# Patient Record
Sex: Male | Born: 1960 | Race: White | Hispanic: No | Marital: Single | Smoking: Never smoker
Health system: Southern US, Community
[De-identification: ages and names within clinical notes are randomized; demographics above are authoritative.]

## PROBLEM LIST (undated history)

## (undated) DIAGNOSIS — C801 Malignant (primary) neoplasm, unspecified: Secondary | ICD-10-CM

## (undated) DIAGNOSIS — M109 Gout, unspecified: Secondary | ICD-10-CM

## (undated) HISTORY — PX: HAND SURGERY: SHX662

## (undated) HISTORY — PX: ROTATOR CUFF REPAIR: SHX139

---

## 2000-02-21 ENCOUNTER — Encounter: Payer: Self-pay | Admitting: *Deleted

## 2000-02-21 ENCOUNTER — Ambulatory Visit (HOSPITAL_COMMUNITY): Admission: RE | Admit: 2000-02-21 | Discharge: 2000-02-21 | Payer: Self-pay | Admitting: *Deleted

## 2001-07-05 ENCOUNTER — Ambulatory Visit (HOSPITAL_COMMUNITY): Admission: RE | Admit: 2001-07-05 | Discharge: 2001-07-05 | Payer: Self-pay | Admitting: Family Medicine

## 2001-07-05 ENCOUNTER — Encounter: Payer: Self-pay | Admitting: Family Medicine

## 2001-07-09 ENCOUNTER — Encounter: Payer: Self-pay | Admitting: Family Medicine

## 2001-07-09 ENCOUNTER — Ambulatory Visit (HOSPITAL_COMMUNITY): Admission: RE | Admit: 2001-07-09 | Discharge: 2001-07-09 | Payer: Self-pay | Admitting: Family Medicine

## 2002-02-11 ENCOUNTER — Encounter: Payer: Self-pay | Admitting: Family Medicine

## 2002-02-11 ENCOUNTER — Ambulatory Visit (HOSPITAL_COMMUNITY): Admission: RE | Admit: 2002-02-11 | Discharge: 2002-02-11 | Payer: Self-pay | Admitting: Family Medicine

## 2018-05-28 MED FILL — FEBUXOSTAT 40 MG TABS: 40 | 30 days supply | Qty: 30 | Fill #0

## 2018-07-01 MED FILL — FEBUXOSTAT 40 MG TABS: 40 | 30 days supply | Qty: 30 | Fill #1

## 2018-07-10 DIAGNOSIS — H5203 Hypermetropia, bilateral: Secondary | ICD-10-CM | POA: Diagnosis not present

## 2018-07-10 DIAGNOSIS — H52223 Regular astigmatism, bilateral: Secondary | ICD-10-CM | POA: Diagnosis not present

## 2018-08-05 MED FILL — FEBUXOSTAT 40 MG TABS: 40 | 30 days supply | Qty: 30 | Fill #0

## 2018-08-20 DIAGNOSIS — Z125 Encounter for screening for malignant neoplasm of prostate: Secondary | ICD-10-CM | POA: Diagnosis not present

## 2018-08-20 DIAGNOSIS — M109 Gout, unspecified: Secondary | ICD-10-CM | POA: Diagnosis not present

## 2018-08-20 DIAGNOSIS — E781 Pure hyperglyceridemia: Secondary | ICD-10-CM | POA: Diagnosis not present

## 2018-08-20 DIAGNOSIS — R82998 Other abnormal findings in urine: Secondary | ICD-10-CM | POA: Diagnosis not present

## 2018-08-20 DIAGNOSIS — Z0000 Encounter for general adult medical examination without abnormal findings: Secondary | ICD-10-CM | POA: Diagnosis not present

## 2018-08-27 DIAGNOSIS — Z0000 Encounter for general adult medical examination without abnormal findings: Secondary | ICD-10-CM | POA: Diagnosis not present

## 2018-08-27 DIAGNOSIS — Z681 Body mass index (BMI) 19 or less, adult: Secondary | ICD-10-CM | POA: Diagnosis not present

## 2018-08-27 DIAGNOSIS — E781 Pure hyperglyceridemia: Secondary | ICD-10-CM | POA: Diagnosis not present

## 2018-08-27 DIAGNOSIS — M109 Gout, unspecified: Secondary | ICD-10-CM | POA: Diagnosis not present

## 2018-08-27 DIAGNOSIS — J328 Other chronic sinusitis: Secondary | ICD-10-CM | POA: Diagnosis not present

## 2018-08-27 DIAGNOSIS — Z1331 Encounter for screening for depression: Secondary | ICD-10-CM | POA: Diagnosis not present

## 2018-08-27 DIAGNOSIS — M720 Palmar fascial fibromatosis [Dupuytren]: Secondary | ICD-10-CM | POA: Diagnosis not present

## 2018-08-27 DIAGNOSIS — Z1212 Encounter for screening for malignant neoplasm of rectum: Secondary | ICD-10-CM | POA: Diagnosis not present

## 2018-08-27 MED FILL — AZELASTINE HCL 137 MCG SPRY: 0.1 | 90 days supply | Qty: 30 | Fill #0

## 2018-09-04 DIAGNOSIS — M720 Palmar fascial fibromatosis [Dupuytren]: Secondary | ICD-10-CM | POA: Diagnosis not present

## 2018-09-04 DIAGNOSIS — M79641 Pain in right hand: Secondary | ICD-10-CM | POA: Diagnosis not present

## 2018-09-12 MED FILL — FEBUXOSTAT 40 MG TABS: 40 | 30 days supply | Qty: 30 | Fill #1

## 2018-09-17 DIAGNOSIS — R195 Other fecal abnormalities: Secondary | ICD-10-CM | POA: Diagnosis not present

## 2018-09-17 DIAGNOSIS — R740 Nonspecific elevation of levels of transaminase and lactic acid dehydrogenase [LDH]: Secondary | ICD-10-CM | POA: Diagnosis not present

## 2018-09-17 DIAGNOSIS — K5730 Diverticulosis of large intestine without perforation or abscess without bleeding: Secondary | ICD-10-CM | POA: Diagnosis not present

## 2018-09-28 MED FILL — FEBUXOSTAT 40 MG TABS: 40 | 90 days supply | Qty: 90 | Fill #0

## 2018-11-26 DIAGNOSIS — M720 Palmar fascial fibromatosis [Dupuytren]: Secondary | ICD-10-CM | POA: Diagnosis not present

## 2018-11-26 DIAGNOSIS — S4991XA Unspecified injury of right shoulder and upper arm, initial encounter: Secondary | ICD-10-CM | POA: Diagnosis not present

## 2018-11-26 MED FILL — CEPHALEXIN 500 MG CAPSULE: 500 | 9 days supply | Qty: 36 | Fill #0

## 2018-11-26 MED FILL — traMADol HCL 50 MG TABS: 50 | 8 days supply | Qty: 50 | Fill #0

## 2018-11-26 MED FILL — ONDANSETRON HCL 8 MG TABLET: 8 | 5 days supply | Qty: 15 | Fill #0

## 2018-12-03 DIAGNOSIS — M25641 Stiffness of right hand, not elsewhere classified: Secondary | ICD-10-CM | POA: Diagnosis not present

## 2018-12-18 DIAGNOSIS — M79641 Pain in right hand: Secondary | ICD-10-CM | POA: Diagnosis not present

## 2019-01-23 MED FILL — FEBUXOSTAT 40 MG TABS: 40 | 90 days supply | Qty: 90 | Fill #1

## 2019-05-19 MED FILL — FEBUXOSTAT 40 MG TABS: 40 | 90 days supply | Qty: 90 | Fill #2

## 2019-06-17 DIAGNOSIS — Z23 Encounter for immunization: Secondary | ICD-10-CM | POA: Diagnosis not present

## 2019-06-17 DIAGNOSIS — L820 Inflamed seborrheic keratosis: Secondary | ICD-10-CM | POA: Diagnosis not present

## 2019-06-17 DIAGNOSIS — D0439 Carcinoma in situ of skin of other parts of face: Secondary | ICD-10-CM | POA: Diagnosis not present

## 2019-06-17 DIAGNOSIS — D485 Neoplasm of uncertain behavior of skin: Secondary | ICD-10-CM | POA: Diagnosis not present

## 2019-06-23 MED FILL — FLUOROURACIL 5 % CREA: 5 | 25 days supply | Qty: 40 | Fill #0

## 2019-08-29 DIAGNOSIS — R82998 Other abnormal findings in urine: Secondary | ICD-10-CM | POA: Diagnosis not present

## 2019-08-29 DIAGNOSIS — Z125 Encounter for screening for malignant neoplasm of prostate: Secondary | ICD-10-CM | POA: Diagnosis not present

## 2019-08-29 DIAGNOSIS — Z0000 Encounter for general adult medical examination without abnormal findings: Secondary | ICD-10-CM | POA: Diagnosis not present

## 2019-08-29 DIAGNOSIS — M109 Gout, unspecified: Secondary | ICD-10-CM | POA: Diagnosis not present

## 2019-08-29 DIAGNOSIS — E781 Pure hyperglyceridemia: Secondary | ICD-10-CM | POA: Diagnosis not present

## 2019-09-05 ENCOUNTER — Other Ambulatory Visit (HOSPITAL_COMMUNITY): Payer: Self-pay | Admitting: Internal Medicine

## 2019-09-05 DIAGNOSIS — M109 Gout, unspecified: Secondary | ICD-10-CM | POA: Diagnosis not present

## 2019-09-05 DIAGNOSIS — Z1331 Encounter for screening for depression: Secondary | ICD-10-CM | POA: Diagnosis not present

## 2019-09-05 DIAGNOSIS — Z0000 Encounter for general adult medical examination without abnormal findings: Secondary | ICD-10-CM | POA: Diagnosis not present

## 2019-09-05 MED FILL — AZELASTINE HCL 137 MCG/SPRA: 137 | 90 days supply | Qty: 30 | Fill #0

## 2019-09-05 MED FILL — FEBUXOSTAT 40 MG TABS: 40 | 90 days supply | Qty: 90 | Fill #0

## 2019-09-10 DIAGNOSIS — L570 Actinic keratosis: Secondary | ICD-10-CM | POA: Diagnosis not present

## 2019-09-10 DIAGNOSIS — Z85828 Personal history of other malignant neoplasm of skin: Secondary | ICD-10-CM | POA: Diagnosis not present

## 2019-09-10 DIAGNOSIS — L578 Other skin changes due to chronic exposure to nonionizing radiation: Secondary | ICD-10-CM | POA: Diagnosis not present

## 2019-09-10 DIAGNOSIS — D225 Melanocytic nevi of trunk: Secondary | ICD-10-CM | POA: Diagnosis not present

## 2020-01-15 MED FILL — FEBUXOSTAT 40 MG TABS: 40 | 90 days supply | Qty: 90 | Fill #0

## 2020-02-09 ENCOUNTER — Other Ambulatory Visit (HOSPITAL_COMMUNITY): Payer: Self-pay | Admitting: Internal Medicine

## 2020-02-09 MED FILL — SHINGRIX 50 MCG SUS: 50 | 1 days supply | Qty: 1 | Fill #0

## 2020-03-18 MED FILL — COLCHICINE 0.6 MG TABS: 0.6 | 7 days supply | Qty: 15 | Fill #0

## 2020-04-10 MED FILL — SHINGRIX 50 MCG SUS: 50 | 1 days supply | Qty: 1 | Fill #1

## 2020-04-22 MED FILL — FEBUXOSTAT 40 MG TABS: 40 | 90 days supply | Qty: 90 | Fill #1

## 2020-06-07 ENCOUNTER — Other Ambulatory Visit (HOSPITAL_COMMUNITY): Payer: Self-pay | Admitting: Registered Nurse

## 2020-06-07 ENCOUNTER — Other Ambulatory Visit (HOSPITAL_COMMUNITY): Payer: Self-pay | Admitting: Internal Medicine

## 2020-06-07 DIAGNOSIS — R1011 Right upper quadrant pain: Secondary | ICD-10-CM

## 2020-06-07 DIAGNOSIS — R110 Nausea: Secondary | ICD-10-CM

## 2020-06-07 MED FILL — ONDANSETRON ODT 4 MG TABLET: 4 | 10 days supply | Qty: 42 | Fill #0

## 2020-06-07 MED FILL — traMADol HCL 50 MG TABS: 50 | 5 days supply | Qty: 15 | Fill #0

## 2020-06-08 ENCOUNTER — Other Ambulatory Visit: Payer: Self-pay

## 2020-06-08 ENCOUNTER — Ambulatory Visit (HOSPITAL_COMMUNITY)
Admission: RE | Admit: 2020-06-08 | Discharge: 2020-06-08 | Disposition: A | Discharge status: Home / Self Care | Payer: 59 | Source: Ambulatory Visit | Attending: Internal Medicine | Admitting: Internal Medicine

## 2020-06-08 DIAGNOSIS — R1011 Right upper quadrant pain: Secondary | ICD-10-CM | POA: Diagnosis not present

## 2020-06-08 DIAGNOSIS — R110 Nausea: Secondary | ICD-10-CM | POA: Insufficient documentation

## 2020-06-08 DIAGNOSIS — K8020 Calculus of gallbladder without cholecystitis without obstruction: Secondary | ICD-10-CM | POA: Diagnosis not present

## 2020-07-28 MED FILL — FEBUXOSTAT 40 MG TABS: 40 | 90 days supply | Qty: 90 | Fill #2

## 2020-09-03 DIAGNOSIS — Z125 Encounter for screening for malignant neoplasm of prostate: Secondary | ICD-10-CM | POA: Diagnosis not present

## 2020-09-03 DIAGNOSIS — Z0000 Encounter for general adult medical examination without abnormal findings: Secondary | ICD-10-CM | POA: Diagnosis not present

## 2020-09-03 DIAGNOSIS — M109 Gout, unspecified: Secondary | ICD-10-CM | POA: Diagnosis not present

## 2020-09-03 DIAGNOSIS — E781 Pure hyperglyceridemia: Secondary | ICD-10-CM | POA: Diagnosis not present

## 2020-09-09 DIAGNOSIS — L570 Actinic keratosis: Secondary | ICD-10-CM | POA: Diagnosis not present

## 2020-09-09 DIAGNOSIS — L821 Other seborrheic keratosis: Secondary | ICD-10-CM | POA: Diagnosis not present

## 2020-09-09 DIAGNOSIS — Z85828 Personal history of other malignant neoplasm of skin: Secondary | ICD-10-CM | POA: Diagnosis not present

## 2020-09-09 DIAGNOSIS — D485 Neoplasm of uncertain behavior of skin: Secondary | ICD-10-CM | POA: Diagnosis not present

## 2020-09-09 DIAGNOSIS — L851 Acquired keratosis [keratoderma] palmaris et plantaris: Secondary | ICD-10-CM | POA: Diagnosis not present

## 2020-09-09 DIAGNOSIS — L578 Other skin changes due to chronic exposure to nonionizing radiation: Secondary | ICD-10-CM | POA: Diagnosis not present

## 2020-09-09 DIAGNOSIS — D225 Melanocytic nevi of trunk: Secondary | ICD-10-CM | POA: Diagnosis not present

## 2020-09-09 DIAGNOSIS — L814 Other melanin hyperpigmentation: Secondary | ICD-10-CM | POA: Diagnosis not present

## 2020-09-10 DIAGNOSIS — K8020 Calculus of gallbladder without cholecystitis without obstruction: Secondary | ICD-10-CM | POA: Diagnosis not present

## 2020-09-10 DIAGNOSIS — M109 Gout, unspecified: Secondary | ICD-10-CM | POA: Diagnosis not present

## 2020-09-10 DIAGNOSIS — R82998 Other abnormal findings in urine: Secondary | ICD-10-CM | POA: Diagnosis not present

## 2020-09-10 DIAGNOSIS — Z0000 Encounter for general adult medical examination without abnormal findings: Secondary | ICD-10-CM | POA: Diagnosis not present

## 2020-09-10 DIAGNOSIS — Z1212 Encounter for screening for malignant neoplasm of rectum: Secondary | ICD-10-CM | POA: Diagnosis not present

## 2020-09-10 DIAGNOSIS — Z1331 Encounter for screening for depression: Secondary | ICD-10-CM | POA: Diagnosis not present

## 2020-11-08 DIAGNOSIS — H52223 Regular astigmatism, bilateral: Secondary | ICD-10-CM | POA: Diagnosis not present

## 2020-11-08 DIAGNOSIS — H5203 Hypermetropia, bilateral: Secondary | ICD-10-CM | POA: Diagnosis not present

## 2020-11-08 DIAGNOSIS — H2513 Age-related nuclear cataract, bilateral: Secondary | ICD-10-CM | POA: Diagnosis not present

## 2020-11-08 DIAGNOSIS — H524 Presbyopia: Secondary | ICD-10-CM | POA: Diagnosis not present

## 2020-11-30 ENCOUNTER — Other Ambulatory Visit (HOSPITAL_COMMUNITY): Payer: Self-pay

## 2020-12-01 ENCOUNTER — Other Ambulatory Visit (HOSPITAL_COMMUNITY): Payer: Self-pay

## 2020-12-02 ENCOUNTER — Other Ambulatory Visit (HOSPITAL_COMMUNITY): Payer: Self-pay

## 2020-12-02 MED ORDER — FEBUXOSTAT 40 MG PO TABS
40.0000 mg | ORAL_TABLET | Freq: Every day | ORAL | 4 refills | Status: DC
  Filled 2020-12-02: qty 90, 90d supply, fill #0
  Filled 2021-03-02: qty 90, 90d supply, fill #1
  Filled 2021-09-05: qty 90, 90d supply, fill #0

## 2020-12-03 ENCOUNTER — Other Ambulatory Visit (HOSPITAL_COMMUNITY): Payer: Self-pay

## 2020-12-13 DIAGNOSIS — D485 Neoplasm of uncertain behavior of skin: Secondary | ICD-10-CM | POA: Diagnosis not present

## 2020-12-13 DIAGNOSIS — C44519 Basal cell carcinoma of skin of other part of trunk: Secondary | ICD-10-CM | POA: Diagnosis not present

## 2020-12-20 DIAGNOSIS — H524 Presbyopia: Secondary | ICD-10-CM | POA: Diagnosis not present

## 2020-12-20 DIAGNOSIS — H52223 Regular astigmatism, bilateral: Secondary | ICD-10-CM | POA: Diagnosis not present

## 2020-12-20 DIAGNOSIS — H5203 Hypermetropia, bilateral: Secondary | ICD-10-CM | POA: Diagnosis not present

## 2020-12-20 DIAGNOSIS — H2513 Age-related nuclear cataract, bilateral: Secondary | ICD-10-CM | POA: Diagnosis not present

## 2020-12-30 DIAGNOSIS — K8010 Calculus of gallbladder with chronic cholecystitis without obstruction: Secondary | ICD-10-CM | POA: Diagnosis not present

## 2021-01-07 DIAGNOSIS — C44519 Basal cell carcinoma of skin of other part of trunk: Secondary | ICD-10-CM | POA: Diagnosis not present

## 2021-01-31 ENCOUNTER — Ambulatory Visit: Payer: Self-pay | Admitting: General Surgery

## 2021-02-07 NOTE — Progress Notes (Addendum)
COVID Vaccine Completed: x3 Date COVID Vaccine completed: Has received booster: COVID vaccine manufacturer: [employer]   Date of COVID positive in last 90 days: No  PCP - Velna Hatchet, MD Cardiologist - N/a  Chest x-ray - N/a EKG - N/a Stress Test - N/a ECHO - N/a Cardiac Cath - N/a Pacemaker/ICD device last checked: N/a Spinal Cord Stimulator: N/a  Sleep Study - N/a CPAP -   Fasting Blood Sugar - N/a Checks Blood Sugar _____ times a day  Blood Thinner Instructions: N/a Aspirin Instructions: Last Dose:  Activity level: Can go up a flight of stairs and perform activities of daily living without stopping and without symptoms of chest pain or shortness of breath.     Anesthesia review:   Patient denies shortness of breath, fever, cough and chest pain at PAT appointment   Patient verbalized understanding of instructions that were given to them at the PAT appointment. Patient was also instructed that they will need to review over the PAT instructions again at home before surgery.

## 2021-02-07 NOTE — Patient Instructions (Addendum)
DUE TO COVID-19 ONLY ONE VISITOR IS ALLOWED TO COME WITH YOU AND STAY IN THE WAITING ROOM ONLY DURING PRE OP AND PROCEDURE.   **NO VISITORS ARE ALLOWED IN THE SHORT STAY AREA OR RECOVERY ROOM!!**       Your procedure is scheduled on: 02/18/21   Report to [facility] Main  Entrance    Report to admitting at 12:30 PM   Call this number if you have problems the morning of surgery [phone]   Do not eat food :After Midnight.   May have liquids until  11:30 AM day of surgery  CLEAR LIQUID DIET  Foods Allowed                                                                     Foods Excluded  Water, Black Coffee and tea, regular and decaf               liquids that you cannot  Plain Jell-O in any flavor  (No red)                                     see through such as: Fruit ices (not with fruit pulp)                                             milk, soups, orange juice              Iced Popsicles (No red)                                                 All solid food                                   Apple juices Sports drinks like Gatorade (No red) Lightly seasoned clear broth or consume(fat free) Sugar, honey syrup    Oral Hygiene is also important to reduce your risk of infection.                                    Remember - BRUSH YOUR TEETH THE MORNING OF SURGERY WITH YOUR REGULAR TOOTHPASTE    Take these medicines the morning of surgery with A SIP OF WATER: Uloric                              You may not have any metal on your body including jewelry, and body piercing             Do not wear lotions, powders, perfumes/cologne, or deodorant              Men may shave face and neck.   Do not bring valuables to the hospital. [facility] IS NOT             RESPONSIBLE   FOR VALUABLES.    Patients discharged the day of surgery will not be allowed to drive home.  Special Instructions: Bring a copy of your healthcare power of attorney and living will documents          the day of surgery if you haven't scanned them in before.   Please read over the following fact sheets you were given: IF YOU HAVE QUESTIONS ABOUT YOUR PRE OP INSTRUCTIONS PLEASE CALL [phone]- [facility] - Preparing for Surgery Before surgery, you can play an important role.  Because skin is not sterile, your skin needs to be as free of germs as possible.  You can reduce the number of germs on your skin by washing with CHG (chlorahexidine gluconate) soap before surgery.  CHG is an antiseptic cleaner which kills germs and bonds with the skin to continue killing germs even after washing. Please DO NOT use if you have an allergy to CHG or antibacterial soaps.  If your skin becomes reddened/irritated stop using the CHG and inform your nurse when you arrive at Short Stay. Do not shave (including legs and underarms) for at least 48 hours prior to the first CHG shower.  You may shave your face/neck.  Please follow these instructions carefully:  1.  Shower with CHG Soap the night before surgery and the  morning of surgery.  2.  If you choose to wash your hair, wash your hair first as usual with your normal  shampoo.  3.  After you shampoo, rinse your hair and body thoroughly to remove the shampoo.                             4.  Use CHG as you would any other liquid soap.  You can apply chg directly to the skin and wash.  Gently with a scrungie or clean washcloth.  5.  Apply the CHG Soap to your body ONLY FROM THE NECK DOWN.   Do   not use on face/ open                           Wound or open sores. Avoid contact with eyes, ears mouth and   genitals (private parts).                       Wash face,  Genitals (private parts) with your normal soap.             6.  Wash thoroughly, paying special attention to the area where your    surgery  will be performed.  7.  Thoroughly rinse your body with warm water from the neck down.  8.  DO NOT shower/wash with your normal soap after using and  rinsing off the CHG Soap.                9.  Pat yourself dry with a clean towel.            10.  Wear clean pajamas.            11.  Place clean sheets on your bed the night of your first shower and do not  sleep with pets. Day of Surgery : Do not apply any lotions/deodorants the morning of surgery.  Please wear clean  clothes to the hospital/surgery center.  FAILURE TO FOLLOW THESE INSTRUCTIONS MAY RESULT IN THE CANCELLATION OF YOUR SURGERY  PATIENT SIGNATURE_________________________________  NURSE SIGNATURE__________________________________  ________________________________________________________________________

## 2021-02-08 ENCOUNTER — Encounter (HOSPITAL_COMMUNITY)
Admission: RE | Admit: 2021-02-08 | Discharge: 2021-02-08 | Disposition: A | Discharge status: Home / Self Care | Payer: 59 | Source: Ambulatory Visit | Attending: General Surgery | Admitting: General Surgery

## 2021-02-08 ENCOUNTER — Encounter (HOSPITAL_COMMUNITY): Payer: Self-pay

## 2021-02-08 ENCOUNTER — Other Ambulatory Visit: Payer: Self-pay

## 2021-02-08 DIAGNOSIS — Z01812 Encounter for preprocedural laboratory examination: Secondary | ICD-10-CM | POA: Diagnosis not present

## 2021-02-08 HISTORY — DX: Gout, unspecified: M10.9

## 2021-02-08 HISTORY — DX: Malignant (primary) neoplasm, unspecified: C80.1

## 2021-02-08 LAB — CBC
HCT: 40.2 % (ref 39.0–52.0)
Hemoglobin: 13.7 g/dL (ref 13.0–17.0)
MCH: 33.3 pg (ref 26.0–34.0)
MCHC: 34.1 g/dL (ref 30.0–36.0)
MCV: 97.6 fL (ref 80.0–100.0)
Platelets: 239 10*3/uL (ref 150–400)
RBC: 4.12 MIL/uL — ABNORMAL LOW (ref 4.22–5.81)
RDW: 11.9 % (ref 11.5–15.5)
WBC: 6.4 10*3/uL (ref 4.0–10.5)
nRBC: 0 % (ref 0.0–0.2)

## 2021-02-18 ENCOUNTER — Ambulatory Visit (HOSPITAL_COMMUNITY): Payer: 59 | Admitting: Certified Registered Nurse Anesthetist

## 2021-02-18 ENCOUNTER — Encounter (HOSPITAL_COMMUNITY): Payer: Self-pay | Admitting: General Surgery

## 2021-02-18 ENCOUNTER — Ambulatory Visit (HOSPITAL_COMMUNITY)
Admission: RE | Admit: 2021-02-18 | Discharge: 2021-02-18 | Disposition: A | Discharge status: Home / Self Care | Payer: 59 | Attending: General Surgery | Admitting: General Surgery

## 2021-02-18 ENCOUNTER — Encounter (HOSPITAL_COMMUNITY)
Admission: RE | Disposition: A | Discharge status: Home / Self Care | Payer: Self-pay | Source: Home / Self Care | Attending: General Surgery

## 2021-02-18 ENCOUNTER — Other Ambulatory Visit (HOSPITAL_COMMUNITY): Payer: Self-pay

## 2021-02-18 DIAGNOSIS — Z880 Allergy status to penicillin: Secondary | ICD-10-CM | POA: Insufficient documentation

## 2021-02-18 DIAGNOSIS — Z885 Allergy status to narcotic agent status: Secondary | ICD-10-CM | POA: Insufficient documentation

## 2021-02-18 DIAGNOSIS — K8080 Other cholelithiasis without obstruction: Secondary | ICD-10-CM | POA: Diagnosis present

## 2021-02-18 DIAGNOSIS — Z888 Allergy status to other drugs, medicaments and biological substances status: Secondary | ICD-10-CM | POA: Diagnosis not present

## 2021-02-18 DIAGNOSIS — Z79899 Other long term (current) drug therapy: Secondary | ICD-10-CM | POA: Insufficient documentation

## 2021-02-18 DIAGNOSIS — K8010 Calculus of gallbladder with chronic cholecystitis without obstruction: Secondary | ICD-10-CM | POA: Insufficient documentation

## 2021-02-18 DIAGNOSIS — K811 Chronic cholecystitis: Secondary | ICD-10-CM | POA: Diagnosis not present

## 2021-02-18 DIAGNOSIS — Z87891 Personal history of nicotine dependence: Secondary | ICD-10-CM | POA: Diagnosis not present

## 2021-02-18 HISTORY — PX: CHOLECYSTECTOMY: SHX55

## 2021-02-18 SURGERY — LAPAROSCOPIC CHOLECYSTECTOMY
Anesthesia: General | Site: Abdomen

## 2021-02-18 MED ORDER — ROCURONIUM BROMIDE 10 MG/ML (PF) SYRINGE
PREFILLED_SYRINGE | INTRAVENOUS | Status: DC | PRN
  Administered 2021-02-18: 40 mg via INTRAVENOUS
  Administered 2021-02-18: 20 mg via INTRAVENOUS

## 2021-02-18 MED ORDER — EPHEDRINE 5 MG/ML INJ
INTRAVENOUS | Status: AC
  Filled 2021-02-18: qty 5

## 2021-02-18 MED ORDER — MIDAZOLAM HCL 2 MG/2ML IJ SOLN
INTRAMUSCULAR | Status: AC
  Filled 2021-02-18: qty 2

## 2021-02-18 MED ORDER — 0.9 % SODIUM CHLORIDE (POUR BTL) OPTIME
TOPICAL | Status: DC | PRN
  Administered 2021-02-18: 1000 mL

## 2021-02-18 MED ORDER — KETOROLAC TROMETHAMINE 30 MG/ML IJ SOLN
30.0000 mg | Freq: Once | INTRAMUSCULAR | Status: AC | PRN
  Administered 2021-02-18: 30 mg via INTRAVENOUS

## 2021-02-18 MED ORDER — CHLORHEXIDINE GLUCONATE CLOTH 2 % EX PADS: 6.0000 | MEDICATED_PAD | Freq: Once | CUTANEOUS | Status: DC

## 2021-02-18 MED ORDER — ONDANSETRON HCL 4 MG/2ML IJ SOLN
INTRAMUSCULAR | Status: DC | PRN
  Administered 2021-02-18: 4 mg via INTRAVENOUS

## 2021-02-18 MED ORDER — ORAL CARE MOUTH RINSE: 15.0000 mL | Freq: Once | OROMUCOSAL | Status: AC

## 2021-02-18 MED ORDER — FENTANYL CITRATE (PF) 100 MCG/2ML IJ SOLN
25.0000 ug | INTRAMUSCULAR | Status: DC | PRN
  Administered 2021-02-18: 50 ug via INTRAVENOUS
  Administered 2021-02-18: 25 ug via INTRAVENOUS

## 2021-02-18 MED ORDER — PROPOFOL 10 MG/ML IV BOLUS
INTRAVENOUS | Status: AC
  Filled 2021-02-18: qty 20

## 2021-02-18 MED ORDER — PHENYLEPHRINE 40 MCG/ML (10ML) SYRINGE FOR IV PUSH (FOR BLOOD PRESSURE SUPPORT)
PREFILLED_SYRINGE | INTRAVENOUS | Status: AC
  Filled 2021-02-18: qty 10

## 2021-02-18 MED ORDER — OXYCODONE HCL 5 MG PO TABS
ORAL_TABLET | ORAL | Status: AC
  Filled 2021-02-18: qty 1

## 2021-02-18 MED ORDER — FENTANYL CITRATE (PF) 250 MCG/5ML IJ SOLN
INTRAMUSCULAR | Status: AC
  Filled 2021-02-18: qty 5

## 2021-02-18 MED ORDER — DEXAMETHASONE SODIUM PHOSPHATE 4 MG/ML IJ SOLN
INTRAMUSCULAR | Status: DC | PRN
  Administered 2021-02-18: 10 mg via INTRAVENOUS

## 2021-02-18 MED ORDER — KETOROLAC TROMETHAMINE 30 MG/ML IJ SOLN
INTRAMUSCULAR | Status: AC
  Filled 2021-02-18: qty 1

## 2021-02-18 MED ORDER — IBUPROFEN 800 MG PO TABS
800.0000 mg | ORAL_TABLET | Freq: Three times a day (TID) | ORAL | 0 refills | Status: AC | PRN
Start: 2021-02-18 — End: ?
  Filled 2021-02-18: qty 30, 10d supply, fill #0

## 2021-02-18 MED ORDER — AMISULPRIDE (ANTIEMETIC) 5 MG/2ML IV SOLN: 10.0000 mg | Freq: Once | INTRAVENOUS | Status: DC | PRN

## 2021-02-18 MED ORDER — PROMETHAZINE HCL 25 MG/ML IJ SOLN: 6.2500 mg | INTRAMUSCULAR | Status: DC | PRN

## 2021-02-18 MED ORDER — OXYCODONE HCL 5 MG PO TABS
5.0000 mg | ORAL_TABLET | Freq: Once | ORAL | Status: AC | PRN
  Administered 2021-02-18: 5 mg via ORAL

## 2021-02-18 MED ORDER — ACETAMINOPHEN 500 MG PO TABS
1000.0000 mg | ORAL_TABLET | ORAL | Status: AC
  Administered 2021-02-18: 1000 mg via ORAL
  Filled 2021-02-18: qty 2

## 2021-02-18 MED ORDER — SUGAMMADEX SODIUM 200 MG/2ML IV SOLN
INTRAVENOUS | Status: DC | PRN
  Administered 2021-02-18: 200 mg via INTRAVENOUS

## 2021-02-18 MED ORDER — DEXAMETHASONE SODIUM PHOSPHATE 10 MG/ML IJ SOLN
INTRAMUSCULAR | Status: AC
  Filled 2021-02-18: qty 1

## 2021-02-18 MED ORDER — CHLORHEXIDINE GLUCONATE 0.12 % MT SOLN
15.0000 mL | Freq: Once | OROMUCOSAL | Status: AC
  Administered 2021-02-18: 15 mL via OROMUCOSAL

## 2021-02-18 MED ORDER — ONDANSETRON HCL 4 MG/2ML IJ SOLN
INTRAMUSCULAR | Status: AC
  Filled 2021-02-18: qty 2

## 2021-02-18 MED ORDER — PHENYLEPHRINE 40 MCG/ML (10ML) SYRINGE FOR IV PUSH (FOR BLOOD PRESSURE SUPPORT)
PREFILLED_SYRINGE | INTRAVENOUS | Status: DC | PRN
  Administered 2021-02-18: 80 ug via INTRAVENOUS

## 2021-02-18 MED ORDER — EPHEDRINE SULFATE-NACL 50-0.9 MG/10ML-% IV SOSY
PREFILLED_SYRINGE | INTRAVENOUS | Status: DC | PRN
  Administered 2021-02-18: 5 mg via INTRAVENOUS

## 2021-02-18 MED ORDER — FENTANYL CITRATE (PF) 100 MCG/2ML IJ SOLN
INTRAMUSCULAR | Status: DC | PRN
  Administered 2021-02-18: 50 ug via INTRAVENOUS
  Administered 2021-02-18: 100 ug via INTRAVENOUS
  Administered 2021-02-18: 50 ug via INTRAVENOUS

## 2021-02-18 MED ORDER — LIDOCAINE 2% (20 MG/ML) 5 ML SYRINGE
INTRAMUSCULAR | Status: DC | PRN
  Administered 2021-02-18: 50 mg via INTRAVENOUS

## 2021-02-18 MED ORDER — PROPOFOL 10 MG/ML IV BOLUS
INTRAVENOUS | Status: DC | PRN
  Administered 2021-02-18: 100 mg via INTRAVENOUS

## 2021-02-18 MED ORDER — LACTATED RINGERS IV SOLN: INTRAVENOUS | Status: DC

## 2021-02-18 MED ORDER — BUPIVACAINE-EPINEPHRINE (PF) 0.25% -1:200000 IJ SOLN
INTRAMUSCULAR | Status: AC
  Filled 2021-02-18: qty 30

## 2021-02-18 MED ORDER — CEFAZOLIN SODIUM-DEXTROSE 2-4 GM/100ML-% IV SOLN
2.0000 g | INTRAVENOUS | Status: AC
  Administered 2021-02-18: 2 g via INTRAVENOUS
  Filled 2021-02-18: qty 100

## 2021-02-18 MED ORDER — OXYCODONE HCL 5 MG PO TABS
5.0000 mg | ORAL_TABLET | Freq: Four times a day (QID) | ORAL | 0 refills | Status: AC | PRN
  Filled 2021-02-18: qty 10, 2d supply, fill #0

## 2021-02-18 MED ORDER — OXYCODONE HCL 5 MG/5ML PO SOLN: 5.0000 mg | Freq: Once | ORAL | Status: AC | PRN

## 2021-02-18 MED ORDER — SODIUM CHLORIDE 0.9 % IR SOLN
Status: DC | PRN
  Administered 2021-02-18: 1000 mL

## 2021-02-18 MED ORDER — MIDAZOLAM HCL 5 MG/5ML IJ SOLN
INTRAMUSCULAR | Status: DC | PRN
  Administered 2021-02-18: 2 mg via INTRAVENOUS

## 2021-02-18 MED ORDER — FENTANYL CITRATE (PF) 100 MCG/2ML IJ SOLN
INTRAMUSCULAR | Status: AC
  Filled 2021-02-18: qty 2

## 2021-02-18 SURGICAL SUPPLY — 50 items
ADH SKN CLS APL DERMABOND .7 (GAUZE/BANDAGES/DRESSINGS) ×1
APL PRP STRL LF DISP 70% ISPRP (MISCELLANEOUS) ×1
APL SKNCLS STERI-STRIP NONHPOA (GAUZE/BANDAGES/DRESSINGS) ×1
APPLIER CLIP 5 13 M/L LIGAMAX5 (MISCELLANEOUS)
APPLIER CLIP ROT 10 11.4 M/L (STAPLE)
APR CLP MED LRG 11.4X10 (STAPLE)
APR CLP MED LRG 5 ANG JAW (MISCELLANEOUS)
BAG COUNTER SPONGE SURGICOUNT (BAG) IMPLANT
BAG SPEC RTRVL 10 TROC 200 (ENDOMECHANICALS) ×1
BAG SPNG CNTER NS LX DISP (BAG)
BENZOIN TINCTURE PRP APPL 2/3 (GAUZE/BANDAGES/DRESSINGS) ×2 IMPLANT
BNDG ADH 1X3 SHEER STRL LF (GAUZE/BANDAGES/DRESSINGS) ×8 IMPLANT
BNDG ADH THN 3X1 STRL LF (GAUZE/BANDAGES/DRESSINGS) ×4
CABLE HIGH FREQUENCY MONO STRZ (ELECTRODE) ×2 IMPLANT
CHLORAPREP W/TINT 26 (MISCELLANEOUS) ×2 IMPLANT
CLIP APPLIE 5 13 M/L LIGAMAX5 (MISCELLANEOUS) IMPLANT
CLIP APPLIE ROT 10 11.4 M/L (STAPLE) IMPLANT
CLIP LIGATING HEMO O LOK GREEN (MISCELLANEOUS) IMPLANT
COVER MAYO STAND STRL (DRAPES) ×2 IMPLANT
COVER SURGICAL LIGHT HANDLE (MISCELLANEOUS) ×2 IMPLANT
DECANTER SPIKE VIAL GLASS SM (MISCELLANEOUS) ×2 IMPLANT
DERMABOND ADVANCED (GAUZE/BANDAGES/DRESSINGS) ×1
DERMABOND ADVANCED .7 DNX12 (GAUZE/BANDAGES/DRESSINGS) IMPLANT
DRAIN CHANNEL 19F RND (DRAIN) IMPLANT
DRAPE C-ARM 42X120 X-RAY (DRAPES) IMPLANT
EVACUATOR SILICONE 100CC (DRAIN) IMPLANT
GLOVE SURG POLYISO LF SZ7 (GLOVE) ×2 IMPLANT
GLOVE SURG UNDER POLY LF SZ7 (GLOVE) ×2 IMPLANT
GOWN STRL REUS W/TWL LRG LVL3 (GOWN DISPOSABLE) ×2 IMPLANT
GOWN STRL REUS W/TWL XL LVL3 (GOWN DISPOSABLE) ×4 IMPLANT
GRASPER SUT TROCAR 14GX15 (MISCELLANEOUS) IMPLANT
KIT BASIN OR (CUSTOM PROCEDURE TRAY) ×2 IMPLANT
KIT TURNOVER KIT A (KITS) ×2 IMPLANT
POUCH RETRIEVAL ECOSAC 10 (ENDOMECHANICALS) ×1 IMPLANT
POUCH RETRIEVAL ECOSAC 10MM (ENDOMECHANICALS) ×2
SCISSORS LAP 5X35 DISP (ENDOMECHANICALS) ×2 IMPLANT
SET CHOLANGIOGRAPH MIX (MISCELLANEOUS) IMPLANT
SET IRRIG TUBING LAPAROSCOPIC (IRRIGATION / IRRIGATOR) ×2 IMPLANT
SET TUBE SMOKE EVAC HIGH FLOW (TUBING) ×2 IMPLANT
SLEEVE XCEL OPT CAN 5 100 (ENDOMECHANICALS) ×4 IMPLANT
STOPCOCK 4 WAY LG BORE MALE ST (IV SETS) IMPLANT
STRIP CLOSURE SKIN 1/2X4 (GAUZE/BANDAGES/DRESSINGS) ×2 IMPLANT
SUT ETHILON 2 0 PS N (SUTURE) IMPLANT
SUT MNCRL AB 4-0 PS2 18 (SUTURE) ×2 IMPLANT
SUT VICRYL 0 ENDOLOOP (SUTURE) IMPLANT
TOWEL OR 17X26 10 PK STRL BLUE (TOWEL DISPOSABLE) ×2 IMPLANT
TOWEL OR NON WOVEN STRL DISP B (DISPOSABLE) IMPLANT
TRAY LAPAROSCOPIC (CUSTOM PROCEDURE TRAY) ×2 IMPLANT
TROCAR BLADELESS OPT 5 100 (ENDOMECHANICALS) ×2 IMPLANT
TROCAR XCEL NON-BLD 11X100MML (ENDOMECHANICALS) ×2 IMPLANT

## 2021-02-18 NOTE — Op Note (Signed)
PATIENT:  Tony Collins  60 y.o. male  PRE-OPERATIVE DIAGNOSIS:  chronic cholecystitis  POST-OPERATIVE DIAGNOSIS:  chronic cholecystitis  PROCEDURE:  Procedure(s): LAPAROSCOPIC CHOLECYSTECTOMY  SURGEON:  Kinsinger, Arta Bruce, MD   ASSISTANT: Zacarias Pontes MD  ANESTHESIA:   local and general  Indications for procedure: Tony Collins is a 60 y.o. male with symptoms of Abdominal pain consistent with gallbladder disease, Confirmed by ultrasound.  Description of procedure: The patient was brought into the operative suite, placed supine. Anesthesia was administered with endotracheal tube. Patient was strapped in place and foot board was secured. All pressure points were offloaded by foam padding. The patient was prepped and draped in the usual sterile fashion.  A periumbilical incision was made and optical entry was used to enter the abdomen. 2 5 mm trocars were placed on in the right lateral space on in the right subcostal space. A 64m trocar was placed in the subxiphoid space. Marcaine with Epinephrine was infused to the subxiphoid space and lateral upper right abdomen in the transversus abdominis plane. Next the patient was placed in reverse trendelenberg. The gallbladder appearedminimally inflamed.   The gallbladder was retracted cephalad and lateral. The peritoneum was reflected off the infundibulum working lateral to medial. The cystic duct and cystic artery were identified and further dissection revealed a critical view. The cystic duct and cystic artery were doubly clipped and ligated.   The gallbladder was removed off the liver bed with cautery. The Gallbladder was placed in a specimen bag. The gallbladder fossa was irrigated and hemostasis was applied with cautery. The gallbladder was removed via the 187mtrocar. The 1268mrocar site was closed with 1-0 interrupted stitch. Pneumoperitoneum was removed, all trocar were removed. All incisions were closed with 4-0 monocryl subcuticular  stitch. Dermabond was applied. The patient woke from anesthesia and was brought to PACU in stable condition. All counts were correct  Findings: Minimally inflamed gallbladder  Specimen: gallbladder  Blood loss: 15 ml  Local anesthesia: 22 ml Marcaine with Epinephrine  Complications: No immediate complications  PLAN OF CARE: Discharge to home after PACU  PATIENT DISPOSITION:  PACU - hemodynamically stable.    [facility], [place]

## 2021-02-18 NOTE — Anesthesia Preprocedure Evaluation (Signed)
Anesthesia Evaluation  Patient identified by MRN, date of birth, ID band Patient awake    Reviewed: Allergy & Precautions, NPO status , Patient's Chart, lab work & pertinent test results  Airway Mallampati: III  TM Distance: >3 FB Neck ROM: Full    Dental no notable dental hx.    Pulmonary neg pulmonary ROS,    Pulmonary exam normal breath sounds clear to auscultation       Cardiovascular negative cardio ROS Normal cardiovascular exam Rhythm:Regular Rate:Normal     Neuro/Psych negative neurological ROS  negative psych ROS   GI/Hepatic negative GI ROS, Neg liver ROS,   Endo/Other  negative endocrine ROS  Renal/GU negative Renal ROS     Musculoskeletal Gout   Abdominal   Peds  Hematology negative hematology ROS (+)   Anesthesia Other Findings chronic cholecystitis  Reproductive/Obstetrics                             Anesthesia Physical Anesthesia Plan  ASA: 1  Anesthesia Plan: General   Post-op Pain Management:    Induction: Intravenous  PONV Risk Score and Plan: 3 and Ondansetron, Dexamethasone, Midazolam and Treatment may vary due to age or medical condition  Airway Management Planned: Oral ETT  Additional Equipment:   Intra-op Plan:   Post-operative Plan: Extubation in OR  Informed Consent: I have reviewed the patients History and Physical, chart, labs and discussed the procedure including the risks, benefits and alternatives for the proposed anesthesia with the patient or authorized representative who has indicated his/her understanding and acceptance.     Dental advisory given  Plan Discussed with: CRNA  Anesthesia Plan Comments:         Anesthesia Quick Evaluation

## 2021-02-18 NOTE — H&P (Signed)
Tony Collins is an 60 y.o. male.   Chief Complaint: abdominal pain HPI: 60 yo male with abdominal pain for the last few months. He has gallstones on ultrasound. He presents for gallbladder removal  Past Medical History:  Diagnosis Date   Cancer ([place])    skin   Gout     Past Surgical History:  Procedure Laterality Date   HAND SURGERY Bilateral    ROTATOR CUFF REPAIR      History reviewed. No pertinent family history. Social History:  reports that he has never smoked. He has quit using smokeless tobacco.  His smokeless tobacco use included snuff. He reports current alcohol use. He reports that he does not use drugs.  Allergies:  Allergies  Allergen Reactions   Chlorpheniramine Other (See Comments)    Hyperactivity   Codeine Nausea And Vomiting   Penicillins Hives    Medications Prior to Admission  Medication Sig Dispense Refill   febuxostat (ULORIC) 40 MG tablet Take 1 tablet by mouth daily 90 tablet 4   febuxostat (ULORIC) 40 MG tablet TAKE 1 TABLET BY MOUTH DAILY (Patient taking differently: Take 40 mg by mouth daily.) 90 tablet 4   ondansetron (ZOFRAN-ODT) 4 MG disintegrating tablet PLACE 1 TABLET ON TONGUE AND ALLOW TO DISSOLVE EVERY 6 HOURS AS NEEDED (Patient not taking: No sig reported) 42 tablet 0    No results found for this or any previous visit (from the past 48 hour(s)). No results found.  Review of Systems  Constitutional:  Negative for chills and fever.  HENT:  Negative for hearing loss.   Respiratory:  Negative for cough.   Cardiovascular:  Negative for chest pain and palpitations.  Gastrointestinal:  Negative for abdominal pain, nausea and vomiting.  Genitourinary:  Negative for dysuria and urgency.  Musculoskeletal:  Negative for myalgias and neck pain.  Skin:  Negative for rash.  Neurological:  Negative for dizziness and headaches.  Hematological:  Does not bruise/bleed easily.  Psychiatric/Behavioral:  Negative for suicidal ideas.    Blood pressure  134/74, pulse 60, temperature 98.2 F (36.8 C), temperature source Oral, resp. rate 15, height '5\' 7"'$  (1.702 m), weight 61.2 kg, SpO2 99 %. Physical Exam Vitals reviewed.  Constitutional:      Appearance: He is well-developed.  HENT:     Head: Normocephalic and atraumatic.  Eyes:     Conjunctiva/sclera: Conjunctivae normal.     Pupils: Pupils are equal, round, and reactive to light.  Cardiovascular:     Rate and Rhythm: Normal rate and regular rhythm.  Pulmonary:     Effort: Pulmonary effort is normal.     Breath sounds: Normal breath sounds.  Abdominal:     General: Bowel sounds are normal. There is no distension.     Palpations: Abdomen is soft.     Tenderness: There is no abdominal tenderness.  Musculoskeletal:        General: Normal range of motion.     Cervical back: Normal range of motion and neck supple.  Skin:    General: Skin is warm and dry.  Neurological:     Mental Status: He is alert and oriented to person, place, and time.  Psychiatric:        Behavior: Behavior normal.    Assessment/Plan 60 yo male with chronic cholecystitis -lap chole -ERAS protocol -planned outpatient procedure  Mickeal Skinner, MD 02/18/2021, 1:42 PM

## 2021-02-18 NOTE — Progress Notes (Signed)
Patient informed that his surgery has been delayed until at least 1515. Patient voiced understanding.  Patient requested his wife be informed also. Called wife, Tony Collins, and informed her of surgery delay and she can go home for a while if she desires.  Wife voiced understanding.

## 2021-02-18 NOTE — Discharge Instructions (Signed)
[facility] ______[facility] LAPAROSCOPIC SURGERY: POST OP INSTRUCTIONS Always review your discharge instruction sheet given to you by the facility where your surgery was performed. IF YOU HAVE DISABILITY OR FAMILY LEAVE FORMS, YOU MUST BRING THEM TO THE OFFICE FOR PROCESSING.   DO NOT GIVE THEM TO YOUR DOCTOR.  A prescription for pain medication may be given to you upon discharge.  Take your pain medication as prescribed, if needed.  If narcotic pain medicine is not needed, then you may take acetaminophen (Tylenol) or ibuprofen (Advil) as needed. Take your usually prescribed medications unless otherwise directed. If you need a refill on your pain medication, please contact your pharmacy.  They will contact our office to request authorization. Prescriptions will not be filled after 5pm or on week-ends. You should follow a light diet the first few days after arrival home, such as soup and crackers, etc.  Be sure to include lots of fluids daily. Most patients will experience some swelling and bruising in the area of the incisions.  Ice packs will help.  Swelling and bruising can take several days to resolve.  It is common to experience some constipation if taking pain medication after surgery.  Increasing fluid intake and taking a stool softener (such as Colace) will usually help or prevent this problem from occurring.  A mild laxative (Milk of Magnesia or Miralax) should be taken according to package instructions if there are no bowel movements after 48 hours. Unless discharge instructions indicate otherwise, you may remove your bandages 24-48 hours after surgery, and you may shower at that time.  You may have steri-strips (small skin tapes) in place directly over the incision.  These strips should be left on the skin for 7-10 days.  If your surgeon used skin glue on the incision, you may shower in 24 hours.  The glue will flake off over the next 2-3 weeks.  Any sutures or staples will be  removed at the office during your follow-up visit. ACTIVITIES:  You may resume regular (light) daily activities beginning the next day--such as daily self-care, walking, climbing stairs--gradually increasing activities as tolerated.  You may have sexual intercourse when it is comfortable.  Refrain from any heavy lifting or straining until approved by your doctor. You may drive when you are no longer taking prescription pain medication, you can comfortably wear a seatbelt, and you can safely maneuver your car and apply brakes. RETURN TO WORK:  __________________________________________________________ You should see your doctor in the office for a follow-up appointment approximately 2-3 weeks after your surgery.  Make sure that you call for this appointment within a day or two after you arrive home to insure a convenient appointment time. OTHER INSTRUCTIONS: __________________________________________________________________________________________________________________________ __________________________________________________________________________________________________________________________ WHEN TO CALL YOUR DOCTOR: Fever over 101.0 Inability to urinate Continued bleeding from incision. Increased pain, redness, or drainage from the incision. Increasing abdominal pain  The clinic staff is available to answer your questions during regular business hours.  Please don't hesitate to call and ask to speak to one of the nurses for clinical concerns.  If you have a medical emergency, go to the nearest emergency room or call 911.  A surgeon from [facility] is always on call at the hospital. [address], [address], [place], [place]  [address] ? [address] [address], [place], [place]   [address] [phone] ? [phone] ? FAX [phone] Web site: [email or web address]  

## 2021-02-18 NOTE — Transfer of Care (Signed)
Immediate Anesthesia Transfer of Care Note  Patient: Tony Collins  Procedure(s) Performed: LAPAROSCOPIC CHOLECYSTECTOMY (Abdomen)  Patient Location: PACU  Anesthesia Type:General  Level of Consciousness: drowsy  Airway & Oxygen Therapy: Patient Spontanous Breathing and Patient connected to face mask oxygen  Post-op Assessment: Report given to RN and Post -op Vital signs reviewed and stable  Post vital signs: Reviewed and stable  Last Vitals:  Vitals Value Taken Time  BP 154/73 02/18/21 1538  Temp 36.4 C 02/18/21 1538  Pulse 82 02/18/21 1542  Resp 16 02/18/21 1542  SpO2 98 % 02/18/21 1542  Vitals shown include unvalidated device data.  Last Pain:  Vitals:   02/18/21 1538  TempSrc:   PainSc: Asleep      Patients Stated Pain Goal: 1 ([id number])  Complications: No notable events documented.

## 2021-02-18 NOTE — Anesthesia Procedure Notes (Addendum)
Procedure Name: Intubation Date/Time: 02/18/2021 2:24 PM Performed by: Claudia Desanctis, CRNA Pre-anesthesia Checklist: Patient identified, Emergency Drugs available, Suction available and Patient being monitored Patient Re-evaluated:Patient Re-evaluated prior to induction Oxygen Delivery Method: Circle system utilized Preoxygenation: Pre-oxygenation with 100% oxygen Induction Type: IV induction Ventilation: Mask ventilation without difficulty Laryngoscope Size: 2 and Miller Grade View: Grade I Tube type: Oral Tube size: 7.5 mm Number of attempts: 1 Airway Equipment and Method: Stylet Placement Confirmation: ETT inserted through vocal cords under direct vision, positive ETCO2 and breath sounds checked- equal and bilateral Secured at: 21 cm Tube secured with: Tape Dental Injury: Teeth and Oropharynx as per pre-operative assessment

## 2021-02-19 NOTE — Anesthesia Postprocedure Evaluation (Signed)
Anesthesia Post Note  Patient: Tony Collins  Procedure(s) Performed: LAPAROSCOPIC CHOLECYSTECTOMY (Abdomen)     Patient location during evaluation: PACU Anesthesia Type: General Level of consciousness: awake Pain management: pain level controlled Vital Signs Assessment: post-procedure vital signs reviewed and stable Respiratory status: spontaneous breathing, nonlabored ventilation, respiratory function stable and patient connected to nasal cannula oxygen Cardiovascular status: blood pressure returned to baseline and stable Postop Assessment: no apparent nausea or vomiting Anesthetic complications: no   No notable events documented.  Last Vitals:  Vitals:   02/18/21 1637 02/18/21 1800  BP: 124/73 134/78  Pulse: 74 79  Resp: 16 16  Temp: 36.5 C 36.5 C  SpO2: 98% 100%    Last Pain:  Vitals:   02/18/21 1800  TempSrc:   PainSc: 5                  Sherrell Weir P Amye Grego

## 2021-02-20 ENCOUNTER — Encounter (HOSPITAL_COMMUNITY): Payer: Self-pay | Admitting: General Surgery

## 2021-02-21 LAB — SURGICAL PATHOLOGY

## 2021-03-02 ENCOUNTER — Other Ambulatory Visit (HOSPITAL_COMMUNITY): Payer: Self-pay

## 2021-09-05 ENCOUNTER — Encounter (HOSPITAL_BASED_OUTPATIENT_CLINIC_OR_DEPARTMENT_OTHER): Payer: Self-pay | Admitting: Pharmacist

## 2021-09-05 ENCOUNTER — Other Ambulatory Visit (HOSPITAL_BASED_OUTPATIENT_CLINIC_OR_DEPARTMENT_OTHER): Payer: Self-pay

## 2021-09-07 ENCOUNTER — Other Ambulatory Visit (HOSPITAL_BASED_OUTPATIENT_CLINIC_OR_DEPARTMENT_OTHER): Payer: Self-pay

## 2021-09-08 ENCOUNTER — Other Ambulatory Visit (HOSPITAL_BASED_OUTPATIENT_CLINIC_OR_DEPARTMENT_OTHER): Payer: Self-pay

## 2021-09-09 ENCOUNTER — Encounter (HOSPITAL_BASED_OUTPATIENT_CLINIC_OR_DEPARTMENT_OTHER): Payer: Self-pay | Admitting: Pharmacist

## 2021-09-09 ENCOUNTER — Other Ambulatory Visit (HOSPITAL_BASED_OUTPATIENT_CLINIC_OR_DEPARTMENT_OTHER): Payer: Self-pay

## 2021-09-12 DIAGNOSIS — L570 Actinic keratosis: Secondary | ICD-10-CM | POA: Diagnosis not present

## 2021-09-12 DIAGNOSIS — L821 Other seborrheic keratosis: Secondary | ICD-10-CM | POA: Diagnosis not present

## 2021-09-12 DIAGNOSIS — L814 Other melanin hyperpigmentation: Secondary | ICD-10-CM | POA: Diagnosis not present

## 2021-09-12 DIAGNOSIS — D225 Melanocytic nevi of trunk: Secondary | ICD-10-CM | POA: Diagnosis not present

## 2021-09-12 DIAGNOSIS — L820 Inflamed seborrheic keratosis: Secondary | ICD-10-CM | POA: Diagnosis not present

## 2021-09-12 DIAGNOSIS — Z85828 Personal history of other malignant neoplasm of skin: Secondary | ICD-10-CM | POA: Diagnosis not present

## 2021-09-12 DIAGNOSIS — L578 Other skin changes due to chronic exposure to nonionizing radiation: Secondary | ICD-10-CM | POA: Diagnosis not present

## 2021-09-16 DIAGNOSIS — R7989 Other specified abnormal findings of blood chemistry: Secondary | ICD-10-CM | POA: Diagnosis not present

## 2021-09-16 DIAGNOSIS — Z125 Encounter for screening for malignant neoplasm of prostate: Secondary | ICD-10-CM | POA: Diagnosis not present

## 2021-09-16 DIAGNOSIS — M109 Gout, unspecified: Secondary | ICD-10-CM | POA: Diagnosis not present

## 2021-09-16 DIAGNOSIS — E781 Pure hyperglyceridemia: Secondary | ICD-10-CM | POA: Diagnosis not present

## 2021-09-21 ENCOUNTER — Other Ambulatory Visit (HOSPITAL_COMMUNITY): Payer: Self-pay

## 2021-09-21 DIAGNOSIS — Z0000 Encounter for general adult medical examination without abnormal findings: Secondary | ICD-10-CM | POA: Diagnosis not present

## 2021-09-21 DIAGNOSIS — M109 Gout, unspecified: Secondary | ICD-10-CM | POA: Diagnosis not present

## 2021-09-21 DIAGNOSIS — R197 Diarrhea, unspecified: Secondary | ICD-10-CM | POA: Diagnosis not present

## 2021-09-21 DIAGNOSIS — R7989 Other specified abnormal findings of blood chemistry: Secondary | ICD-10-CM | POA: Diagnosis not present

## 2021-09-21 DIAGNOSIS — Z1331 Encounter for screening for depression: Secondary | ICD-10-CM | POA: Diagnosis not present

## 2021-09-21 DIAGNOSIS — K8020 Calculus of gallbladder without cholecystitis without obstruction: Secondary | ICD-10-CM | POA: Diagnosis not present

## 2021-09-21 DIAGNOSIS — Z1339 Encounter for screening examination for other mental health and behavioral disorders: Secondary | ICD-10-CM | POA: Diagnosis not present

## 2021-09-21 MED ORDER — CHOLESTYRAMINE 4 G PO PACK
PACK | ORAL | 1 refills | Status: DC
  Filled 2021-09-21 (×2): qty 30, 30d supply, fill #0
  Filled 2021-10-14: qty 30, 30d supply, fill #1

## 2021-10-14 ENCOUNTER — Other Ambulatory Visit (HOSPITAL_COMMUNITY): Payer: Self-pay

## 2021-10-17 ENCOUNTER — Other Ambulatory Visit (HOSPITAL_COMMUNITY): Payer: Self-pay

## 2021-10-21 DIAGNOSIS — R7989 Other specified abnormal findings of blood chemistry: Secondary | ICD-10-CM | POA: Diagnosis not present

## 2021-10-26 ENCOUNTER — Other Ambulatory Visit (HOSPITAL_COMMUNITY): Payer: Self-pay

## 2021-10-26 MED ORDER — CHOLESTYRAMINE 4 G PO PACK
4.0000 g | PACK | Freq: Every day | ORAL | 1 refills | Status: AC
  Filled 2021-10-26 – 2021-11-04 (×2): qty 30, 30d supply, fill #0
  Filled 2021-12-14: qty 18, 18d supply, fill #0
  Filled 2021-12-15: qty 12, 12d supply, fill #0

## 2021-11-04 ENCOUNTER — Other Ambulatory Visit (HOSPITAL_COMMUNITY): Payer: Self-pay

## 2021-11-04 MED ORDER — CHOLESTYRAMINE 4 G PO PACK
4.0000 g | PACK | Freq: Every day | ORAL | 1 refills | Status: AC
  Filled 2021-11-04: qty 30, 30d supply, fill #0

## 2021-11-08 ENCOUNTER — Other Ambulatory Visit (HOSPITAL_COMMUNITY): Payer: Self-pay

## 2021-11-23 ENCOUNTER — Other Ambulatory Visit (HOSPITAL_BASED_OUTPATIENT_CLINIC_OR_DEPARTMENT_OTHER): Payer: Self-pay

## 2021-11-23 MED ORDER — COLCHICINE 0.6 MG PO TABS
ORAL_TABLET | ORAL | 0 refills | Status: AC
  Filled 2021-11-23: qty 15, 8d supply, fill #0

## 2021-12-14 ENCOUNTER — Other Ambulatory Visit (HOSPITAL_BASED_OUTPATIENT_CLINIC_OR_DEPARTMENT_OTHER): Payer: Self-pay

## 2021-12-14 ENCOUNTER — Other Ambulatory Visit (HOSPITAL_COMMUNITY): Payer: Self-pay

## 2021-12-14 DIAGNOSIS — M109 Gout, unspecified: Secondary | ICD-10-CM | POA: Diagnosis not present

## 2021-12-14 DIAGNOSIS — M25561 Pain in right knee: Secondary | ICD-10-CM | POA: Diagnosis not present

## 2021-12-14 MED ORDER — FEBUXOSTAT 40 MG PO TABS
40.0000 mg | ORAL_TABLET | Freq: Every day | ORAL | 5 refills | Status: AC
  Filled 2021-12-14: qty 90, 90d supply, fill #0
  Filled 2022-03-07: qty 90, 90d supply, fill #1
  Filled 2022-06-27: qty 90, 90d supply, fill #2
  Filled 2022-09-21: qty 90, 90d supply, fill #3

## 2021-12-14 MED ORDER — PREDNISONE 20 MG PO TABS
20.0000 mg | ORAL_TABLET | Freq: Two times a day (BID) | ORAL | 0 refills | Status: AC
  Filled 2021-12-14 (×2): qty 10, 5d supply, fill #0

## 2021-12-15 ENCOUNTER — Other Ambulatory Visit (HOSPITAL_BASED_OUTPATIENT_CLINIC_OR_DEPARTMENT_OTHER): Payer: Self-pay

## 2022-03-07 ENCOUNTER — Other Ambulatory Visit (HOSPITAL_BASED_OUTPATIENT_CLINIC_OR_DEPARTMENT_OTHER): Payer: Self-pay

## 2022-03-25 IMAGING — US US ABDOMEN LIMITED
1 series · 14 of 25 positions shown · non-contrast
Comparison: None.

CLINICAL DATA: Right upper quadrant pain with nausea

EXAM:
ULTRASOUND ABDOMEN LIMITED RIGHT UPPER QUADRANT

[Series 1: us abdomen limited · 14 of 55 slices shown]
[im 1/55]
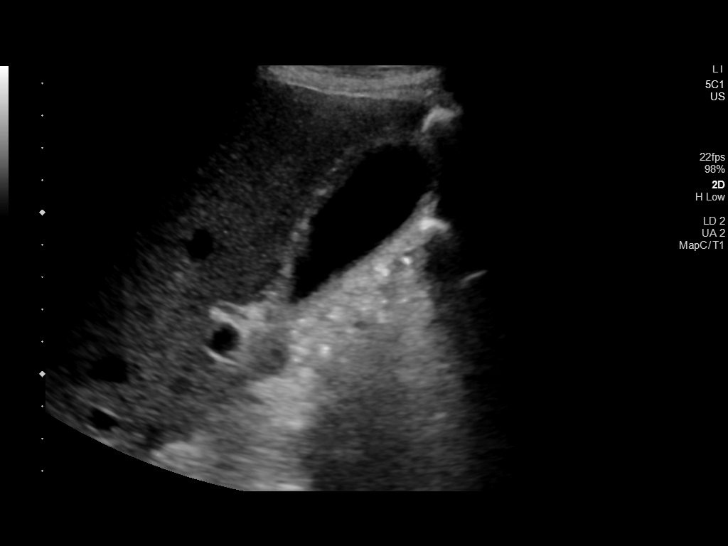
[im 5/55]
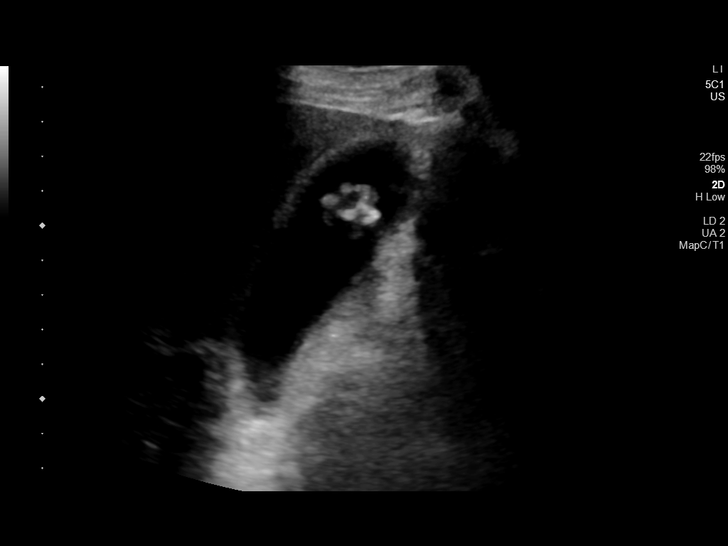
[im 10/55]
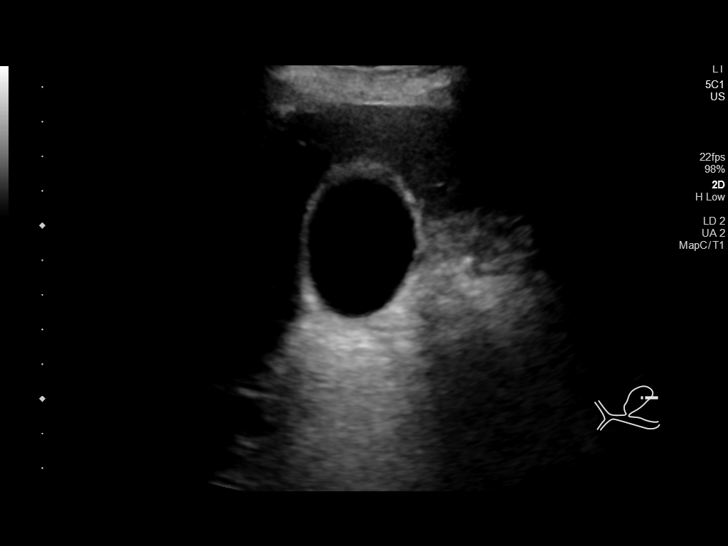
[im 14/55]
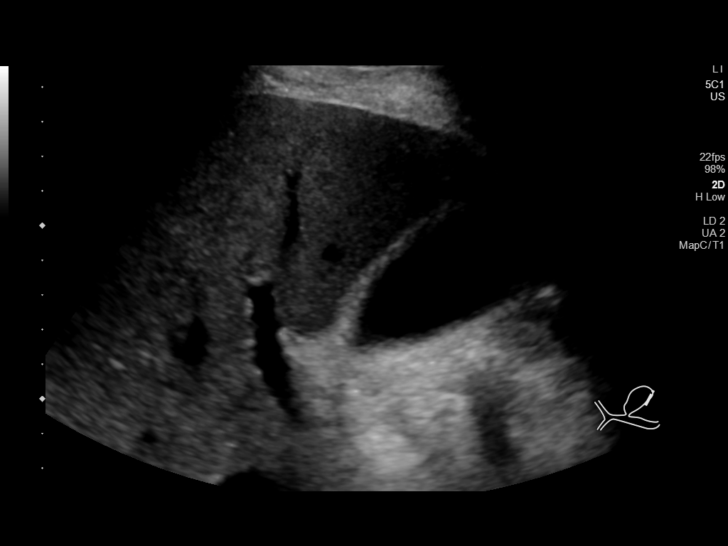
[im 19/55]
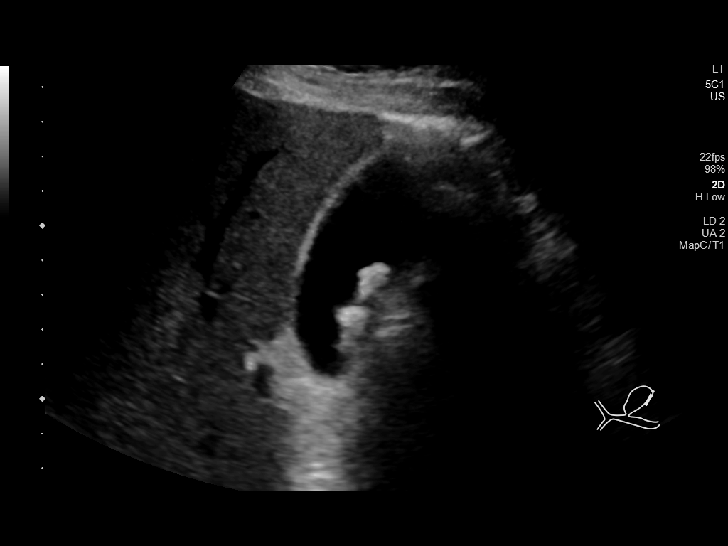
[im 21/55]
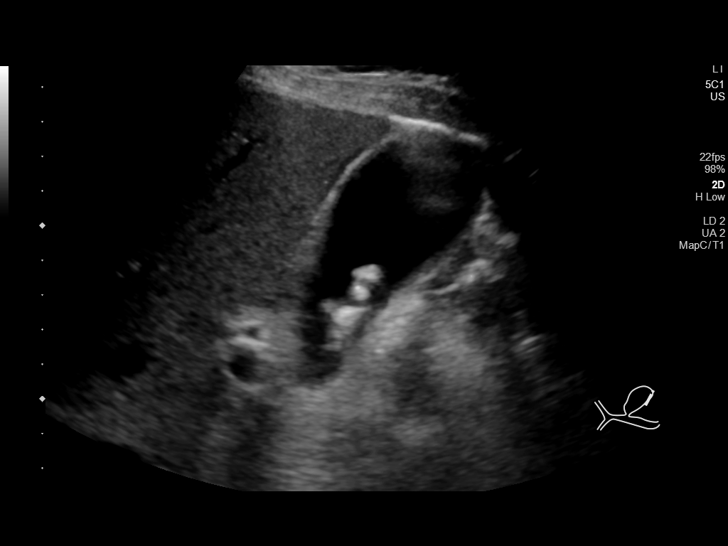
[im 25/55]
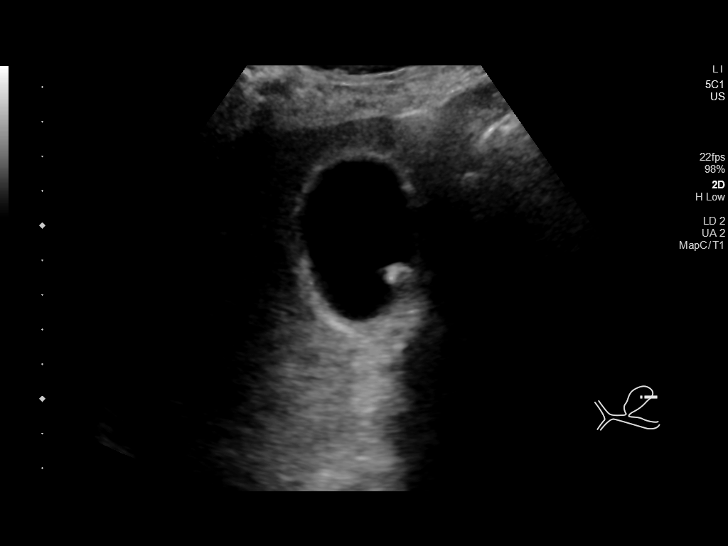
[im 30/55]
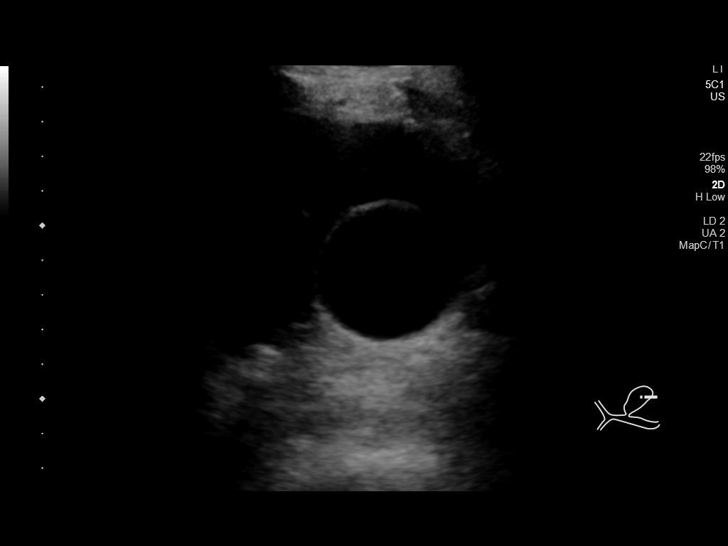
[im 34/55]
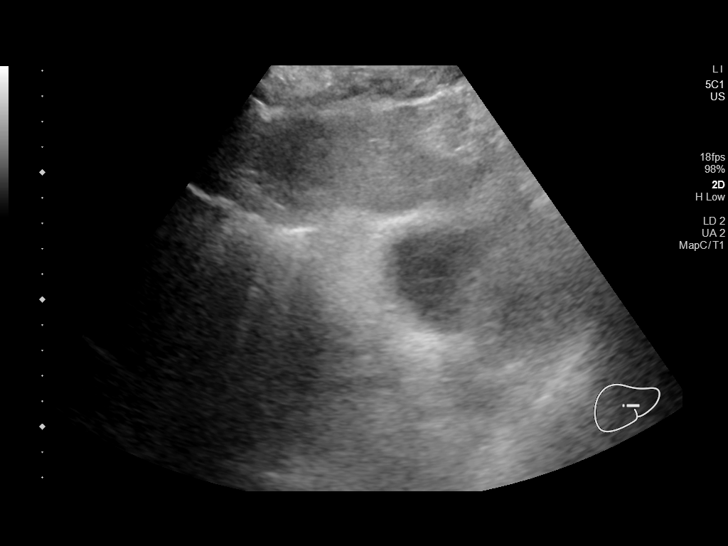
[im 37/55]
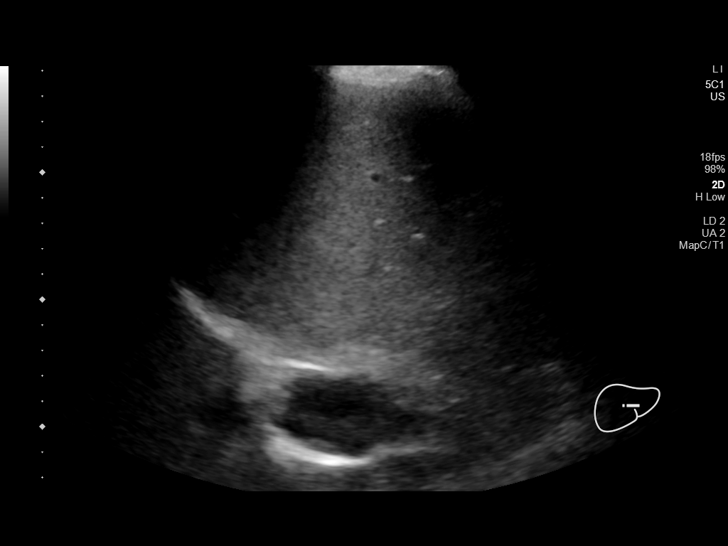
[im 41/55]
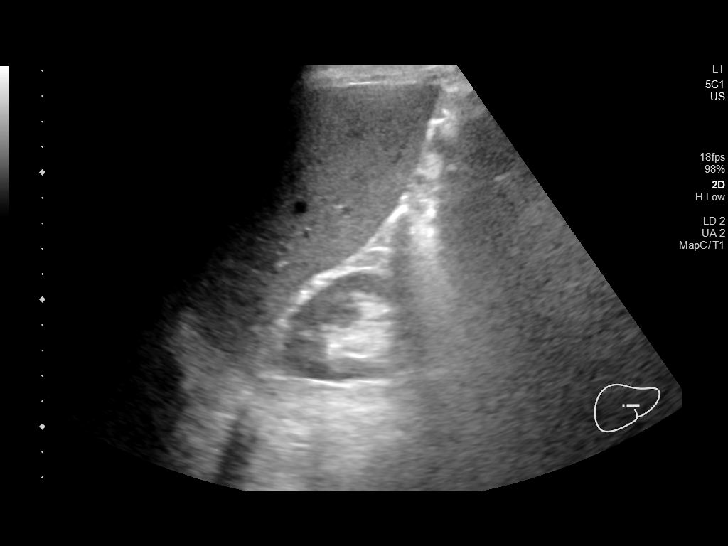
[im 46/55]
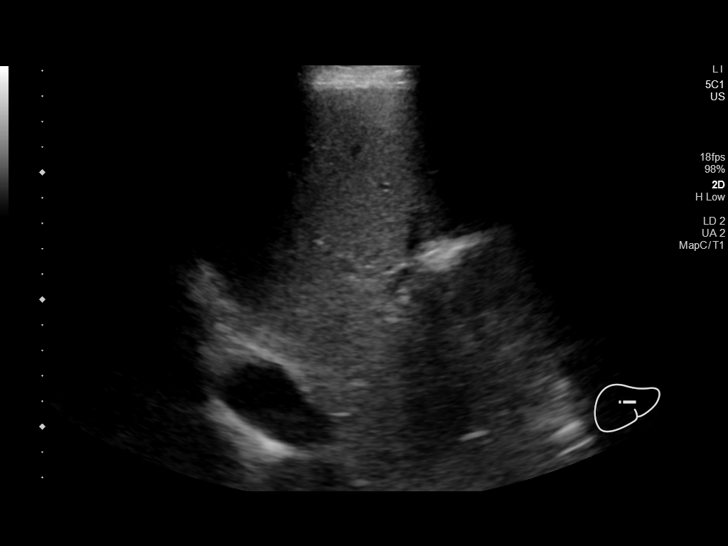
[im 50/55]
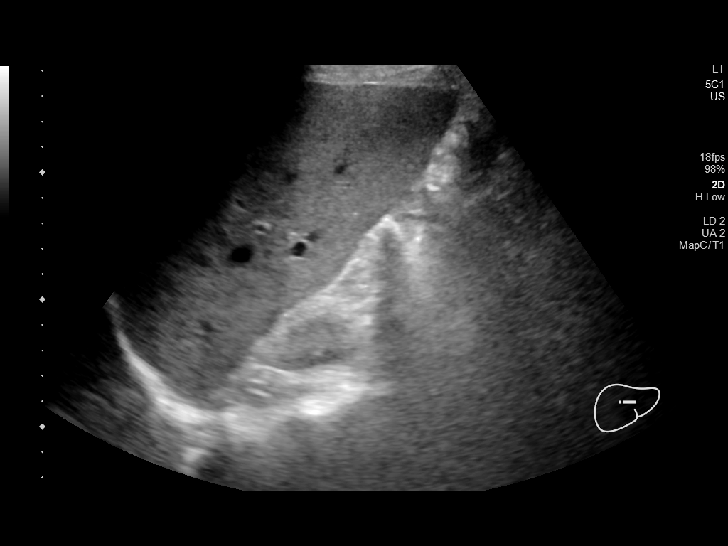
[im 55/55]
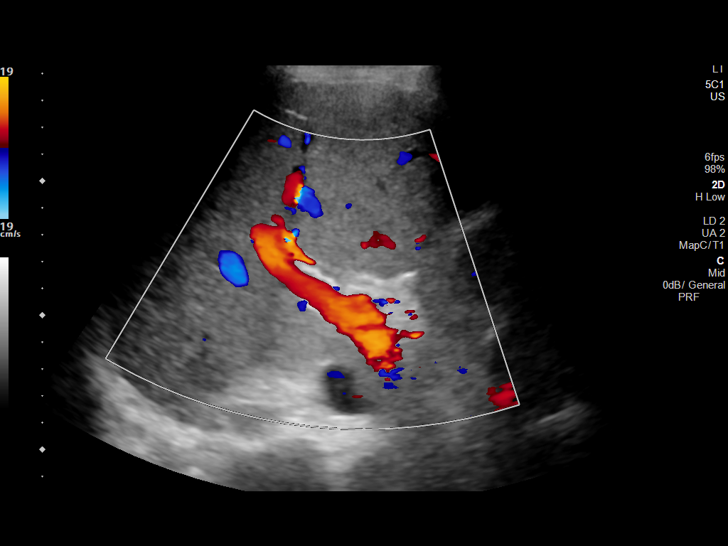

[14 of 25 positions shown; findings below may reference images not displayed]

FINDINGS: Gallbladder:

Multiple mobile gallstones measuring up to 7 mm. Mild wall
thickening measuring up to 4 mm. No gallbladder distension or
pericholecystic fluid. No sonographic Murphy sign noted by
sonographer.

Common bile duct:

Diameter: 3 mm, normal

Liver:

No focal lesion identified. Increased parenchymal echogenicity.
Portal vein is patent on color Doppler imaging with normal direction
of blood flow towards the liver.

Other: None.
IMPRESSION: Cholelithiasis and mild gallbladder wall thickening without
additional sonographic evidence of acute cholecystitis.

Increased liver echogenicity likely reflecting steatosis.

## 2022-04-18 ENCOUNTER — Other Ambulatory Visit (HOSPITAL_BASED_OUTPATIENT_CLINIC_OR_DEPARTMENT_OTHER): Payer: Self-pay

## 2022-04-18 MED ORDER — COVID-19 MRNA VAC-TRIS(PFIZER) 30 MCG/0.3ML IM SUSY
PREFILLED_SYRINGE | INTRAMUSCULAR | 0 refills | Status: AC
  Filled 2022-04-18: qty 0.3, 1d supply, fill #0

## 2022-09-18 DIAGNOSIS — L578 Other skin changes due to chronic exposure to nonionizing radiation: Secondary | ICD-10-CM | POA: Diagnosis not present

## 2022-09-18 DIAGNOSIS — D225 Melanocytic nevi of trunk: Secondary | ICD-10-CM | POA: Diagnosis not present

## 2022-09-18 DIAGNOSIS — L905 Scar conditions and fibrosis of skin: Secondary | ICD-10-CM | POA: Diagnosis not present

## 2022-09-18 DIAGNOSIS — L814 Other melanin hyperpigmentation: Secondary | ICD-10-CM | POA: Diagnosis not present

## 2022-09-18 DIAGNOSIS — L570 Actinic keratosis: Secondary | ICD-10-CM | POA: Diagnosis not present

## 2022-09-18 DIAGNOSIS — Z85828 Personal history of other malignant neoplasm of skin: Secondary | ICD-10-CM | POA: Diagnosis not present

## 2022-09-18 DIAGNOSIS — L821 Other seborrheic keratosis: Secondary | ICD-10-CM | POA: Diagnosis not present

## 2022-09-18 DIAGNOSIS — D485 Neoplasm of uncertain behavior of skin: Secondary | ICD-10-CM | POA: Diagnosis not present

## 2022-09-21 ENCOUNTER — Other Ambulatory Visit (HOSPITAL_BASED_OUTPATIENT_CLINIC_OR_DEPARTMENT_OTHER): Payer: Self-pay

## 2022-10-02 DIAGNOSIS — Z7689 Persons encountering health services in other specified circumstances: Secondary | ICD-10-CM | POA: Diagnosis not present

## 2022-10-02 DIAGNOSIS — E781 Pure hyperglyceridemia: Secondary | ICD-10-CM | POA: Diagnosis not present

## 2022-10-02 DIAGNOSIS — R739 Hyperglycemia, unspecified: Secondary | ICD-10-CM | POA: Diagnosis not present

## 2022-10-09 ENCOUNTER — Other Ambulatory Visit (HOSPITAL_COMMUNITY): Payer: Self-pay

## 2022-10-09 ENCOUNTER — Encounter (HOSPITAL_BASED_OUTPATIENT_CLINIC_OR_DEPARTMENT_OTHER): Payer: Self-pay

## 2022-10-09 ENCOUNTER — Other Ambulatory Visit (HOSPITAL_BASED_OUTPATIENT_CLINIC_OR_DEPARTMENT_OTHER): Payer: Self-pay

## 2022-10-09 MED ORDER — CHOLESTYRAMINE 4 G PO PACK
1.0000 | PACK | Freq: Every day | ORAL | 1 refills | Status: DC
  Filled 2022-10-09: qty 18, 18d supply, fill #0
  Filled 2022-10-09: qty 30, 30d supply, fill #0
  Filled 2022-10-09: qty 12, 12d supply, fill #0
  Filled 2023-01-08: qty 30, 30d supply, fill #1

## 2022-10-09 MED ORDER — CHOLESTYRAMINE 4 G PO PACK
PACK | ORAL | 0 refills | Status: AC
  Filled 2022-10-09: qty 30, 30d supply, fill #0
  Filled 2023-03-30: qty 60, 60d supply, fill #0
  Filled 2023-03-30: qty 60, 30d supply, fill #0

## 2022-10-09 MED ORDER — AZELASTINE HCL 137 MCG/SPRAY NA SOLN
137.0000 ug | Freq: Every day | NASAL | 3 refills | Status: AC
  Filled 2022-10-09: qty 30, 100d supply, fill #0
  Filled 2023-01-08: qty 30, 90d supply, fill #1

## 2022-10-09 MED ORDER — FEBUXOSTAT 40 MG PO TABS
40.0000 mg | ORAL_TABLET | Freq: Every day | ORAL | 3 refills | Status: DC
  Filled 2023-01-08: qty 90, 90d supply, fill #0
  Filled 2023-03-30: qty 30, 30d supply, fill #1
  Filled 2023-03-30: qty 60, 60d supply, fill #1
  Filled 2023-03-30: qty 90, 90d supply, fill #1
  Filled 2023-06-22: qty 90, 90d supply, fill #2
  Filled 2023-09-17: qty 90, 90d supply, fill #3

## 2022-10-11 ENCOUNTER — Other Ambulatory Visit (HOSPITAL_BASED_OUTPATIENT_CLINIC_OR_DEPARTMENT_OTHER): Payer: Self-pay

## 2022-10-20 DIAGNOSIS — L905 Scar conditions and fibrosis of skin: Secondary | ICD-10-CM | POA: Diagnosis not present

## 2022-10-20 DIAGNOSIS — D485 Neoplasm of uncertain behavior of skin: Secondary | ICD-10-CM | POA: Diagnosis not present

## 2023-01-08 ENCOUNTER — Other Ambulatory Visit (HOSPITAL_BASED_OUTPATIENT_CLINIC_OR_DEPARTMENT_OTHER): Payer: Self-pay

## 2023-01-09 ENCOUNTER — Other Ambulatory Visit: Payer: Self-pay

## 2023-01-09 ENCOUNTER — Other Ambulatory Visit (HOSPITAL_BASED_OUTPATIENT_CLINIC_OR_DEPARTMENT_OTHER): Payer: Self-pay

## 2023-03-21 DIAGNOSIS — H5203 Hypermetropia, bilateral: Secondary | ICD-10-CM | POA: Diagnosis not present

## 2023-03-30 ENCOUNTER — Other Ambulatory Visit: Payer: Self-pay

## 2023-03-30 ENCOUNTER — Other Ambulatory Visit (HOSPITAL_BASED_OUTPATIENT_CLINIC_OR_DEPARTMENT_OTHER): Payer: Self-pay

## 2023-03-30 MED ORDER — COVID-19 MRNA VAC-TRIS(PFIZER) 30 MCG/0.3ML IM SUSY
0.3000 mL | PREFILLED_SYRINGE | Freq: Once | INTRAMUSCULAR | 0 refills | Status: AC
  Filled 2023-03-30: qty 0.3, 1d supply, fill #0

## 2023-03-30 MED ORDER — INFLUENZA VIRUS VACC SPLIT PF (FLUZONE) 0.5 ML IM SUSY
0.5000 mL | PREFILLED_SYRINGE | Freq: Once | INTRAMUSCULAR | 0 refills | Status: AC
  Filled 2023-03-30: qty 0.5, 1d supply, fill #0

## 2023-03-30 MED ORDER — CHOLESTYRAMINE 4 G PO PACK
4.0000 g | PACK | Freq: Every day | ORAL | 0 refills | Status: DC
  Filled 2023-03-30 – 2023-06-22 (×2): qty 60, 60d supply, fill #0

## 2023-04-12 DIAGNOSIS — L814 Other melanin hyperpigmentation: Secondary | ICD-10-CM | POA: Diagnosis not present

## 2023-04-12 DIAGNOSIS — L821 Other seborrheic keratosis: Secondary | ICD-10-CM | POA: Diagnosis not present

## 2023-04-12 DIAGNOSIS — L820 Inflamed seborrheic keratosis: Secondary | ICD-10-CM | POA: Diagnosis not present

## 2023-04-12 DIAGNOSIS — Z789 Other specified health status: Secondary | ICD-10-CM | POA: Diagnosis not present

## 2023-04-12 DIAGNOSIS — D1801 Hemangioma of skin and subcutaneous tissue: Secondary | ICD-10-CM | POA: Diagnosis not present

## 2023-04-12 DIAGNOSIS — L2989 Other pruritus: Secondary | ICD-10-CM | POA: Diagnosis not present

## 2023-06-22 ENCOUNTER — Other Ambulatory Visit: Payer: Self-pay

## 2023-06-22 ENCOUNTER — Other Ambulatory Visit (HOSPITAL_BASED_OUTPATIENT_CLINIC_OR_DEPARTMENT_OTHER): Payer: Self-pay

## 2023-07-10 ENCOUNTER — Other Ambulatory Visit (HOSPITAL_BASED_OUTPATIENT_CLINIC_OR_DEPARTMENT_OTHER): Payer: Self-pay

## 2023-07-10 DIAGNOSIS — R0981 Nasal congestion: Secondary | ICD-10-CM | POA: Diagnosis not present

## 2023-07-10 DIAGNOSIS — J069 Acute upper respiratory infection, unspecified: Secondary | ICD-10-CM | POA: Diagnosis not present

## 2023-07-10 DIAGNOSIS — R051 Acute cough: Secondary | ICD-10-CM | POA: Diagnosis not present

## 2023-07-10 MED ORDER — FLONASE SENSIMIST 27.5 MCG/SPRAY NA SUSP: NASAL | 11 refills | Status: AC

## 2023-07-10 MED ORDER — AZITHROMYCIN 250 MG PO TABS
ORAL_TABLET | ORAL | 0 refills | Status: AC
  Filled 2023-07-10: qty 6, 5d supply, fill #0

## 2023-08-29 ENCOUNTER — Other Ambulatory Visit (HOSPITAL_BASED_OUTPATIENT_CLINIC_OR_DEPARTMENT_OTHER): Payer: Self-pay

## 2023-08-29 DIAGNOSIS — J069 Acute upper respiratory infection, unspecified: Secondary | ICD-10-CM | POA: Diagnosis not present

## 2023-08-29 DIAGNOSIS — R058 Other specified cough: Secondary | ICD-10-CM | POA: Diagnosis not present

## 2023-08-29 MED ORDER — AZITHROMYCIN 250 MG PO TABS
ORAL_TABLET | ORAL | 0 refills | Status: AC
  Filled 2023-08-29: qty 6, 5d supply, fill #0

## 2023-09-17 ENCOUNTER — Other Ambulatory Visit (HOSPITAL_BASED_OUTPATIENT_CLINIC_OR_DEPARTMENT_OTHER): Payer: Self-pay

## 2023-09-17 MED ORDER — CHOLESTYRAMINE 4 G PO PACK
4.0000 g | PACK | ORAL | 1 refills | Status: AC
  Filled 2023-09-17: qty 30, 30d supply, fill #0
  Filled 2023-09-17: qty 18, 18d supply, fill #0
  Filled 2023-09-17: qty 12, 12d supply, fill #0

## 2023-10-15 DIAGNOSIS — L821 Other seborrheic keratosis: Secondary | ICD-10-CM | POA: Diagnosis not present

## 2023-10-15 DIAGNOSIS — E785 Hyperlipidemia, unspecified: Secondary | ICD-10-CM | POA: Diagnosis not present

## 2023-10-15 DIAGNOSIS — L814 Other melanin hyperpigmentation: Secondary | ICD-10-CM | POA: Diagnosis not present

## 2023-10-15 DIAGNOSIS — Z131 Encounter for screening for diabetes mellitus: Secondary | ICD-10-CM | POA: Diagnosis not present

## 2023-10-15 DIAGNOSIS — L570 Actinic keratosis: Secondary | ICD-10-CM | POA: Diagnosis not present

## 2023-10-15 DIAGNOSIS — C44612 Basal cell carcinoma of skin of right upper limb, including shoulder: Secondary | ICD-10-CM | POA: Diagnosis not present

## 2023-10-15 DIAGNOSIS — M109 Gout, unspecified: Secondary | ICD-10-CM | POA: Diagnosis not present

## 2023-10-15 DIAGNOSIS — D485 Neoplasm of uncertain behavior of skin: Secondary | ICD-10-CM | POA: Diagnosis not present

## 2023-10-15 DIAGNOSIS — D225 Melanocytic nevi of trunk: Secondary | ICD-10-CM | POA: Diagnosis not present

## 2023-10-15 DIAGNOSIS — Z125 Encounter for screening for malignant neoplasm of prostate: Secondary | ICD-10-CM | POA: Diagnosis not present

## 2023-10-15 DIAGNOSIS — R7989 Other specified abnormal findings of blood chemistry: Secondary | ICD-10-CM | POA: Diagnosis not present

## 2023-10-22 ENCOUNTER — Other Ambulatory Visit (HOSPITAL_BASED_OUTPATIENT_CLINIC_OR_DEPARTMENT_OTHER): Payer: Self-pay

## 2023-10-22 DIAGNOSIS — Z1331 Encounter for screening for depression: Secondary | ICD-10-CM | POA: Diagnosis not present

## 2023-10-22 DIAGNOSIS — Z1339 Encounter for screening examination for other mental health and behavioral disorders: Secondary | ICD-10-CM | POA: Diagnosis not present

## 2023-10-22 DIAGNOSIS — K8020 Calculus of gallbladder without cholecystitis without obstruction: Secondary | ICD-10-CM | POA: Diagnosis not present

## 2023-10-22 DIAGNOSIS — R972 Elevated prostate specific antigen [PSA]: Secondary | ICD-10-CM | POA: Diagnosis not present

## 2023-10-22 DIAGNOSIS — E785 Hyperlipidemia, unspecified: Secondary | ICD-10-CM | POA: Diagnosis not present

## 2023-10-22 DIAGNOSIS — Z0000 Encounter for general adult medical examination without abnormal findings: Secondary | ICD-10-CM | POA: Diagnosis not present

## 2023-10-22 DIAGNOSIS — M109 Gout, unspecified: Secondary | ICD-10-CM | POA: Diagnosis not present

## 2023-10-22 DIAGNOSIS — R197 Diarrhea, unspecified: Secondary | ICD-10-CM | POA: Diagnosis not present

## 2023-10-22 MED ORDER — CHOLESTYRAMINE 4 G PO PACK
4.0000 g | PACK | Freq: Every day | ORAL | 1 refills | Status: AC
  Filled 2023-10-22: qty 30, 30d supply, fill #0
  Filled 2023-12-11: qty 30, 30d supply, fill #1

## 2023-10-22 MED ORDER — FEBUXOSTAT 40 MG PO TABS
40.0000 mg | ORAL_TABLET | Freq: Every day | ORAL | 3 refills | Status: AC
  Filled 2023-10-22 – 2023-12-24 (×2): qty 90, 90d supply, fill #0
  Filled 2024-03-13: qty 90, 90d supply, fill #1

## 2023-10-29 DIAGNOSIS — C44612 Basal cell carcinoma of skin of right upper limb, including shoulder: Secondary | ICD-10-CM | POA: Diagnosis not present

## 2023-10-29 DIAGNOSIS — L905 Scar conditions and fibrosis of skin: Secondary | ICD-10-CM | POA: Diagnosis not present

## 2023-12-24 ENCOUNTER — Other Ambulatory Visit (HOSPITAL_BASED_OUTPATIENT_CLINIC_OR_DEPARTMENT_OTHER): Payer: Self-pay

## 2024-03-05 ENCOUNTER — Other Ambulatory Visit (HOSPITAL_BASED_OUTPATIENT_CLINIC_OR_DEPARTMENT_OTHER): Payer: Self-pay

## 2024-03-05 MED ORDER — CHOLESTYRAMINE 4 G PO PACK
PACK | ORAL | 0 refills | Status: AC
  Filled 2024-03-05: qty 90, 90d supply, fill #0

## 2024-03-06 ENCOUNTER — Other Ambulatory Visit (HOSPITAL_BASED_OUTPATIENT_CLINIC_OR_DEPARTMENT_OTHER): Payer: Self-pay

## 2024-03-15 ENCOUNTER — Other Ambulatory Visit (HOSPITAL_BASED_OUTPATIENT_CLINIC_OR_DEPARTMENT_OTHER): Payer: Self-pay

## 2024-03-15 MED ORDER — FLUZONE 0.5 ML IM SUSY
0.5000 mL | PREFILLED_SYRINGE | Freq: Once | INTRAMUSCULAR | 0 refills | Status: AC
  Filled 2024-03-15: qty 0.5, 1d supply, fill #0

## 2024-03-16 ENCOUNTER — Other Ambulatory Visit (HOSPITAL_BASED_OUTPATIENT_CLINIC_OR_DEPARTMENT_OTHER): Payer: Self-pay

## 2024-03-17 DIAGNOSIS — L538 Other specified erythematous conditions: Secondary | ICD-10-CM | POA: Diagnosis not present

## 2024-03-17 DIAGNOSIS — L820 Inflamed seborrheic keratosis: Secondary | ICD-10-CM | POA: Diagnosis not present

## 2024-03-17 DIAGNOSIS — L2989 Other pruritus: Secondary | ICD-10-CM | POA: Diagnosis not present

## 2024-03-17 DIAGNOSIS — Z789 Other specified health status: Secondary | ICD-10-CM | POA: Diagnosis not present

## 2024-03-24 DIAGNOSIS — Z125 Encounter for screening for malignant neoplasm of prostate: Secondary | ICD-10-CM | POA: Diagnosis not present

## 2024-05-05 ENCOUNTER — Other Ambulatory Visit: Payer: Self-pay

## 2024-07-23 ENCOUNTER — Other Ambulatory Visit (HOSPITAL_BASED_OUTPATIENT_CLINIC_OR_DEPARTMENT_OTHER): Payer: Self-pay

## 2024-07-29 ENCOUNTER — Other Ambulatory Visit: Payer: Self-pay
